# Patient Record
Sex: Male | Born: 1990 | Race: Black or African American | Hispanic: No | Marital: Single | Smoking: Never smoker
Health system: Southern US, Community
[De-identification: ages and names within clinical notes are randomized; demographics above are authoritative.]

## PROBLEM LIST (undated history)

## (undated) DIAGNOSIS — F909 Attention-deficit hyperactivity disorder, unspecified type: Secondary | ICD-10-CM

## (undated) DIAGNOSIS — E785 Hyperlipidemia, unspecified: Secondary | ICD-10-CM

## (undated) HISTORY — DX: Hyperlipidemia, unspecified: E78.5

## (undated) HISTORY — DX: Attention-deficit hyperactivity disorder, unspecified type: F90.9

---
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
## 2008-01-22 DIAGNOSIS — J029 Acute pharyngitis, unspecified: Secondary | ICD-10-CM | POA: Insufficient documentation

## 2008-01-28 ENCOUNTER — Ambulatory Visit: Payer: Self-pay | Admitting: Family Medicine

## 2008-01-28 LAB — CONVERTED CEMR LAB
Cholesterol: 208 mg/dL (ref 0–200)
Direct LDL: 143.5 mg/dL
HDL: 38.5 mg/dL — ABNORMAL LOW (ref >39.0)
Rapid Strep: NEGATIVE
Total CHOL/HDL Ratio: 5.4
Triglycerides: 130 mg/dL (ref 0–149)
VLDL: 26 mg/dL (ref 0–40)

## 2008-02-04 ENCOUNTER — Telehealth: Payer: Self-pay | Admitting: Family Medicine

## 2008-06-16 ENCOUNTER — Telehealth: Payer: Self-pay | Admitting: *Deleted

## 2008-06-23 ENCOUNTER — Ambulatory Visit: Payer: Self-pay | Admitting: Family Medicine

## 2008-06-23 LAB — CONVERTED CEMR LAB
Amphetamine Screen, Ur: NEGATIVE
Barbiturate Quant, Ur: NEGATIVE
Benzodiazepines.: NEGATIVE
Cocaine Metabolites: NEGATIVE
Creatinine,U: 262.2 mg/dL
Marijuana Metabolite: POSITIVE — AB
Methadone: NEGATIVE
Opiate Screen, Urine: NEGATIVE
Phencyclidine (PCP): NEGATIVE
Propoxyphene: NEGATIVE

## 2008-06-26 ENCOUNTER — Telehealth: Payer: Self-pay | Admitting: Family Medicine

## 2008-08-26 ENCOUNTER — Telehealth: Payer: Self-pay | Admitting: *Deleted

## 2008-12-15 ENCOUNTER — Ambulatory Visit: Payer: Self-pay | Admitting: Family Medicine

## 2009-11-09 ENCOUNTER — Ambulatory Visit: Payer: Self-pay | Admitting: Licensed Clinical Social Worker

## 2009-11-16 ENCOUNTER — Ambulatory Visit: Payer: Self-pay | Admitting: Licensed Clinical Social Worker

## 2009-11-25 ENCOUNTER — Ambulatory Visit: Payer: Self-pay | Admitting: Licensed Clinical Social Worker

## 2009-12-08 ENCOUNTER — Ambulatory Visit: Payer: Self-pay | Admitting: Licensed Clinical Social Worker

## 2009-12-22 ENCOUNTER — Ambulatory Visit: Payer: Self-pay | Admitting: Licensed Clinical Social Worker

## 2010-01-04 ENCOUNTER — Ambulatory Visit: Payer: Self-pay | Admitting: Licensed Clinical Social Worker

## 2010-01-10 ENCOUNTER — Ambulatory Visit: Payer: Self-pay | Admitting: Licensed Clinical Social Worker

## 2010-01-11 ENCOUNTER — Ambulatory Visit: Payer: Self-pay | Admitting: Licensed Clinical Social Worker

## 2010-01-19 ENCOUNTER — Ambulatory Visit: Payer: Self-pay | Admitting: Licensed Clinical Social Worker

## 2010-02-02 ENCOUNTER — Ambulatory Visit: Payer: Self-pay | Admitting: Licensed Clinical Social Worker

## 2010-02-17 ENCOUNTER — Ambulatory Visit: Payer: Self-pay | Admitting: Licensed Clinical Social Worker

## 2010-03-03 ENCOUNTER — Ambulatory Visit: Payer: Self-pay | Admitting: Licensed Clinical Social Worker

## 2010-03-07 ENCOUNTER — Telehealth: Payer: Self-pay | Admitting: Family Medicine

## 2010-03-08 ENCOUNTER — Ambulatory Visit: Payer: Self-pay | Admitting: Family Medicine

## 2010-03-08 LAB — CONVERTED CEMR LAB
ALT: 20 units/L (ref 0–53)
AST: 28 units/L (ref 0–37)
Albumin: 4.8 g/dL (ref 3.5–5.2)
Alkaline Phosphatase: 70 units/L (ref 39–117)
BUN: 22 mg/dL (ref 6–23)
Basophils Absolute: 0 10*3/uL (ref 0.0–0.1)
Basophils Relative: 0.4 % (ref 0.0–3.0)
Bilirubin Urine: NEGATIVE
Bilirubin, Direct: 0.1 mg/dL (ref 0.0–0.3)
Blood in Urine, dipstick: NEGATIVE
CO2: 27 meq/L (ref 19–32)
Calcium: 10 mg/dL (ref 8.4–10.5)
Chloride: 102 meq/L (ref 96–112)
Cholesterol: 212 mg/dL — ABNORMAL HIGH (ref 0–200)
Creatinine, Ser: 1.1 mg/dL (ref 0.4–1.5)
Direct LDL: 131.1 mg/dL
Eosinophils Absolute: 0 10*3/uL (ref 0.0–0.7)
Eosinophils Relative: 0.4 % (ref 0.0–5.0)
GFR calc non Af Amer: 108.24 mL/min (ref >60)
Glucose, Bld: 77 mg/dL (ref 70–99)
Glucose, Urine, Semiquant: NEGATIVE
HCT: 43.1 % (ref 39.0–52.0)
HDL: 56.1 mg/dL (ref >39.00)
Hemoglobin: 14.4 g/dL (ref 13.0–17.0)
Lymphocytes Relative: 29.7 % (ref 12.0–46.0)
Lymphs Abs: 1.4 10*3/uL (ref 0.7–4.0)
MCHC: 33.5 g/dL (ref 30.0–36.0)
MCV: 99.1 fL (ref 78.0–100.0)
Monocytes Absolute: 0.2 10*3/uL (ref 0.1–1.0)
Monocytes Relative: 5.2 % (ref 3.0–12.0)
Neutro Abs: 3 10*3/uL (ref 1.4–7.7)
Neutrophils Relative %: 64.3 % (ref 43.0–77.0)
Nitrite: NEGATIVE
Platelets: 193 10*3/uL (ref 150.0–400.0)
Potassium: 4.6 meq/L (ref 3.5–5.1)
RBC: 4.35 M/uL (ref 4.22–5.81)
RDW: 12.8 % (ref 11.5–14.6)
Sodium: 138 meq/L (ref 135–145)
Specific Gravity, Urine: 1.02
TSH: 0.85 microintl units/mL (ref 0.35–5.50)
Total Bilirubin: 0.7 mg/dL (ref 0.3–1.2)
Total CHOL/HDL Ratio: 4
Total Protein: 7.1 g/dL (ref 6.0–8.3)
Triglycerides: 74 mg/dL (ref 0.0–149.0)
Urobilinogen, UA: 0.2
VLDL: 14.8 mg/dL (ref 0.0–40.0)
WBC Urine, dipstick: NEGATIVE
WBC: 4.7 10*3/uL (ref 4.5–10.5)
pH: 6

## 2010-03-10 ENCOUNTER — Ambulatory Visit: Payer: Self-pay | Admitting: Family Medicine

## 2010-03-24 ENCOUNTER — Ambulatory Visit: Payer: Self-pay | Admitting: Licensed Clinical Social Worker

## 2010-04-14 ENCOUNTER — Ambulatory Visit: Payer: Self-pay | Admitting: Licensed Clinical Social Worker

## 2010-05-10 ENCOUNTER — Ambulatory Visit: Payer: Self-pay | Admitting: Licensed Clinical Social Worker

## 2010-07-19 NOTE — Progress Notes (Signed)
Summary: Pt is req to get some other labs added to cpx labs  Phone Note Call from Patient Call back at [phone] cell   Caller: Patient Summary of Call: Pt is sch for cpx labs on 03/08/10. Pt is req to get std test added to labs. Pls advise.     Initial call taken by: Lucy Antigua,  March 07, 2010 8:56 AM  Follow-up for Phone Call        ok Follow-up by: Roderick Pee MD,  March 07, 2010 4:11 PM  Additional Follow-up for Phone Call Additional follow up Details #1::        Pt has not every had an std test done before in his life and would like to be checked for any std's Additional Follow-up by: Kathrynn Speed CMA,  March 07, 2010 4:35 PM    Additional Follow-up for Phone Call Additional follow up Details #2::    rachel..pls call.set up std's Follow-up by: Roderick Pee MD,  March 07, 2010 9:08 PM  Additional Follow-up for Phone Call Additional follow up Details #3:: Details for Additional Follow-up Action Taken: patient came to lab today Additional Follow-up by: Kern Reap CMA Duncan Dull),  March 08, 2010 5:29 PM

## 2010-07-19 NOTE — Assessment & Plan Note (Signed)
Summary: cpx/njr   Vital Signs:  Patient profile:   20 year old male Height:      71.25 inches Weight:      176 pounds BMI:     24.46 Temp:     98.1 degrees F oral BP sitting:   110 / 70  (left arm) Cuff size:   regular  Vitals Entered By: Kern Reap CMA Duncan Dull) (March 10, 2010 9:26 AM) CC: cpx Is Patient Diabetic? No Pain Assessment Patient in pain? no        CC:  cpx.  History of Present Illness: Andrew Lee is a 20 year old single male, who now lives at home and goes to [employer] [facility] comes in today for general physical examination  Is always been in excellent health.  He said no chronic health problems.  At one time he took Adderall for ADD.  However, he no longer thinks that he needs it.  He, states he's doing well in school.  Plays basketball for exercise  Allergies: No Known Drug Allergies  Past History:  Past medical, surgical, family and social histories (including risk factors) reviewed, and no changes noted (except as noted below).  Past Medical History: Reviewed history from 01/28/2008 and no changes required. ADD  Family History: Reviewed history from 01/28/2008 and no changes required. Family History High cholesterol  Social History: Reviewed history from 01/28/2008 and no changes required. Occupation: Single Never Smoked Alcohol use-no Drug use-no Regular exercise-yes  Review of Systems      See HPI  Physical Exam  General:  Well-developed,well-nourished,in no acute distress; alert,appropriate and cooperative throughout examination Head:  Normocephalic and atraumatic without obvious abnormalities. No apparent alopecia or balding. Eyes:  No corneal or conjunctival inflammation noted. EOMI. Perrla. Funduscopic exam benign, without hemorrhages, exudates or papilledema. Vision grossly normal. Ears:  External ear exam shows no significant lesions or deformities.  Otoscopic examination reveals clear canals, tympanic membranes are intact bilaterally  without bulging, retraction, inflammation or discharge. Hearing is grossly normal bilaterally. Nose:  External nasal examination shows no deformity or inflammation. Nasal mucosa are pink and moist without lesions or exudates. Mouth:  Oral mucosa and oropharynx without lesions or exudates.  Teeth in good repair. Neck:  No deformities, masses, or tenderness noted. Chest Wall:  No deformities, masses, tenderness or gynecomastia noted. Breasts:  No masses or gynecomastia noted Lungs:  Normal respiratory effort, chest expands symmetrically. Lungs are clear to auscultation, no crackles or wheezes. Heart:  Normal rate and regular rhythm. S1 and S2 normal without gallop, murmur, click, rub or other extra sounds. Abdomen:  Bowel sounds positive,abdomen soft and non-tender without masses, organomegaly or hernias noted. Genitalia:  Testes bilaterally descended without nodularity, tenderness or masses. No scrotal masses or lesions. No penis lesions or urethral discharge. Msk:  No deformity or scoliosis noted of thoracic or lumbar spine.   Pulses:  R and L carotid,radial,femoral,dorsalis pedis and posterior tibial pulses are full and equal bilaterally Extremities:  No clubbing, cyanosis, edema, or deformity noted with normal full range of motion of all joints.   Neurologic:  No cranial nerve deficits noted. Station and gait are normal. Plantar reflexes are down-going bilaterally. DTRs are symmetrical throughout. Sensory, motor and coordinative functions appear intact. Skin:  Intact without suspicious lesions or rashes Cervical Nodes:  No lymphadenopathy noted Axillary Nodes:  No palpable lymphadenopathy Inguinal Nodes:  No significant adenopathy Psych:  Cognition and judgment appear intact. Alert and cooperative with normal attention span and concentration. No apparent delusions, illusions, hallucinations  Impression & Recommendations:  Problem # 1:  ADD (ICD-314.00) Assessment Unchanged  Complete  Medication List: 1)  Zyrtec Allergy 10 Mg Tabs (Cetirizine hcl) .... As needed 2)  Adderall Xr 20 Mg Cp24 (Amphetamine-dextroamphetamine) .... Take 1 tablet by mouth once a day 3)  Adderall Xr 20 Mg Xr24h-cap (Amphetamine-dextroamphetamine) .... Take one tab once daily   fill in one month 4)  Adderall Xr 20 Mg Xr24h-cap (Amphetamine-dextroamphetamine) .... Take one tab once daily   fill in two months  Patient Instructions: 1)  Please schedule a follow-up appointment in 1 year.   Immunization History:  Tetanus/Td Immunization History:    Tetanus/Td:  historical (06/19/2006)

## 2010-08-01 ENCOUNTER — Ambulatory Visit (INDEPENDENT_AMBULATORY_CARE_PROVIDER_SITE_OTHER)
Admission: RE | Admit: 2010-08-01 | Discharge: 2010-08-01 | Disposition: A | Discharge status: Home / Self Care | Payer: Self-pay | Source: Ambulatory Visit | Attending: Family Medicine | Admitting: Family Medicine

## 2010-08-01 ENCOUNTER — Telehealth: Payer: Self-pay | Admitting: Family Medicine

## 2010-08-01 ENCOUNTER — Encounter: Payer: Self-pay | Admitting: Family Medicine

## 2010-08-01 ENCOUNTER — Ambulatory Visit (INDEPENDENT_AMBULATORY_CARE_PROVIDER_SITE_OTHER): Payer: Self-pay | Admitting: Family Medicine

## 2010-08-01 VITALS — BP 120/80 | Temp 98.1°F | Ht 71.25 in | Wt 183.0 lb

## 2010-08-01 DIAGNOSIS — M25511 Pain in right shoulder: Secondary | ICD-10-CM

## 2010-08-01 DIAGNOSIS — M25519 Pain in unspecified shoulder: Secondary | ICD-10-CM

## 2010-08-01 MED ORDER — HYDROCODONE-ACETAMINOPHEN 7.5-750 MG PO TABS: 1.0000 | ORAL_TABLET | Freq: Four times a day (QID) | ORAL | Status: AC | PRN

## 2010-08-01 NOTE — Progress Notes (Signed)
  Subjective:    Patient ID: Andrew Lee, male    DOB: 1991/06/08, 20 y.o.   MRN: [id number]  HPI Andrew Lee is a 20 year old male, who comes in today for evaluation of pain in his right shoulder.  He was playing basketball.  This morning and fell on his right shoulder.  He says the pain is a 7 on a scale of one to 10.  He points to his right upper shoulder between the cervical spine as the source of his pain.  Most the skeletal review of systems otherwise negative   Review of Systems     Objective:   Physical Exam Well-developed male in no acute distress.  Examination of the hand, elbow and shoulder all appear normal and has full range of motion of the shoulder area.  Some tenderness in the muscle.  Clavicle appears normal       Assessment & Plan:  Right shoulder pain   Will sent for x-rays to rule out fracture, then ice, and Vicodin

## 2010-08-01 NOTE — Patient Instructions (Signed)
After your x-ray, then go home.  I will call you the report

## 2010-08-01 NOTE — Telephone Encounter (Signed)
I called and talked with mom about Andrew Lee's x-ray.  X-ray shows no fracture.  Treat symptomatically with Motrin, ice, Vicodin at bedtime as needed.  Return p.r.n.

## 2010-08-24 ENCOUNTER — Ambulatory Visit (INDEPENDENT_AMBULATORY_CARE_PROVIDER_SITE_OTHER): Payer: Managed Care, Other (non HMO) | Admitting: Internal Medicine

## 2010-08-24 ENCOUNTER — Encounter: Payer: Self-pay | Admitting: Internal Medicine

## 2010-08-24 VITALS — BP 110/70 | HR 86 | Temp 98.4°F | Ht 71.0 in | Wt 181.0 lb

## 2010-08-24 DIAGNOSIS — Z202 Contact with and (suspected) exposure to infections with a predominantly sexual mode of transmission: Secondary | ICD-10-CM

## 2010-08-24 MED ORDER — AZITHROMYCIN 250 MG PO TABS: ORAL_TABLET | ORAL | Status: DC

## 2010-08-25 ENCOUNTER — Telehealth: Payer: Self-pay

## 2010-08-25 NOTE — Telephone Encounter (Signed)
Message copied by Kyung Rudd on Thu Aug 25, 2010  2:17 PM ------      Message from: Letitia Libra, Maisie Fus      Created: Thu Aug 25, 2010 12:08 PM       Std testing neg

## 2010-08-25 NOTE — Telephone Encounter (Signed)
Pt aware.

## 2010-08-25 NOTE — Telephone Encounter (Signed)
Message copied by Kyung Rudd on Thu Aug 25, 2010 12:16 PM ------      Message from: Letitia Libra, Maisie Fus      Created: Thu Aug 25, 2010 12:08 PM       Std testing neg

## 2010-08-29 ENCOUNTER — Encounter: Payer: Self-pay | Admitting: Internal Medicine

## 2010-08-29 DIAGNOSIS — Z202 Contact with and (suspected) exposure to infections with a predominantly sexual mode of transmission: Secondary | ICD-10-CM | POA: Insufficient documentation

## 2010-08-29 NOTE — Progress Notes (Signed)
  Subjective:    Patient ID: Andrew Lee, male    DOB: 09-30-1990, 20 y.o.   MRN: [id number]  HPI Pt presents to clinic for evaluation of possible STD. States has had recent intercourse with male diagnosed with chlamydia. She was tested and treated and knows of no other std's. Pt is asx and denies urethral discharge, penile pain or dysuria. Chart review indicates neg HIV, RPR dated 9/11. No other complaints.  Reviewed pmh, medications, and allergies.    Review of Systems  Constitutional: Negative for fever and chills.  Genitourinary: Negative for dysuria, urgency, frequency, penile swelling, genital sores, penile pain and testicular pain.  Skin: Negative for color change and rash.        Objective:   Physical Exam  Nursing note and vitals reviewed. Constitutional: He appears well-developed and well-nourished. No distress.  HENT:  Head: Normocephalic and atraumatic.  Eyes: Conjunctivae are normal.  Genitourinary: Testes normal and penis normal. No penile erythema or penile tenderness. No discharge found.  Neurological: He is alert.  Skin: Skin is warm and dry. No rash noted. He is not diaphoretic. No erythema.          Assessment & Plan:

## 2010-08-29 NOTE — Assessment & Plan Note (Signed)
Asx. Given known chlamydia exposure prescribe 1gm zithromax. Obtain repeat hiv as well as urine gc and chlamydia.

## 2010-09-05 ENCOUNTER — Ambulatory Visit: Payer: Self-pay | Admitting: Family Medicine

## 2010-11-04 NOTE — Discharge Summary (Signed)
NAME:  Andrew Lee, Andrew Lee                        ACCOUNT NO.:  [id number]   MEDICAL RECORD NO.:  [id number]                   PATIENT TYPE:  OBV   LOCATION:  6123                                 FACILITY:  [facility]   PHYSICIAN:  Billey Gosling, M.D.                 DATE OF BIRTH:  1990/12/04   DATE OF ADMISSION:  10/19/2002  DATE OF DISCHARGE:  10/21/2002                                 DISCHARGE SUMMARY   DISCHARGE DIAGNOSES:  1. Right scapular abscess.  2. Multiple lung abscesses.  3. Febrile illness.   CONSULTATIONS:  Pediatric surgery, Dr. Levie Heritage.   PROCEDURES:  1. Ultrasound of right scapula.  2. MRI of right scapula.   DISCHARGE MEDICATIONS:  1. Ancef 1 g IV q.8h.  2. Tylenol 650 mg every four hours p.r.n. pain.  3. Ibuprofen 400 mg every six hours p.r.n. pain.   ADMISSION HISTORY:  This 20 year old African American male presented for  emergent medical care secondary to a four-day history of fever and right  shoulder and scapular swelling.  The patient reports he suffered right  shoulder trauma one week prior to presentation while playing basketball and  hitting a wall.  The patient developed fevers to 104 degrees starting four  days prior to admission.   HOSPITAL COURSE:  The patient was admitted and started on IV Ancef for  possible abscess versus infected hematoma of right scapular region.  Ultrasound was obtained which showed a 3 x 3 x 1 cm hyperechoic collection  consistent with infected hematoma or abscess.  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  The patient continued to spike fevers to 102 over two days of  hospitalization and he was in no acute distress, eating well.  Pediatric  surgery saw the patient and recommended orthopedic surgery consult.  Orthopedic surgery was then contacted and recommended transfer to  [facility] for further treatment with possible surgery of the scapular  abscess.   LABORATORY DATA:  Urinalysis within normal limits.  Urine cultures pending.  Basic metabolic panel showed sodium 135, potassium 4.3, chloride 99, bicarb  27, glucose 93, BUN 15, creatinine 0.7, calcium 8.8, albumin 2.9,  total protein 7, AST 22, ALT 20, alkaline phosphatase 190, total bilirubin  0.6.  PT 14.5, INR 1.1, PTT 36.  Sed rate 118.  Monospot test negative.  C-  reactive protein 17.3.  [facility] spotted fever test pending.  Blood  cultures x2 have been negative to date.                                               Billey Gosling, M.D.  AS/MEDQ  D:  10/21/2002  T:  10/21/2002  Job:  [id number]   cc:   Dr. Irineo Axon  [facility]

## 2012-05-01 ENCOUNTER — Telehealth: Payer: Self-pay | Admitting: Family Medicine

## 2012-05-01 NOTE — Telephone Encounter (Signed)
noted 

## 2012-05-01 NOTE — Telephone Encounter (Signed)
Caller: Hugo/Patient; Patient Name: Andrew Lee; PCP: Kelle Darting [facility]); Best Callback Phone Number: [phone]. Call regarding lump and rendess to right upper eyelid.  Afebrile.  Emergent symtpoms ruled out per Eye: Infections or Irritation protocol w/exception to 'Single localized red, swollen, tender lump on upper or lower edge or inside eyelid'.  Home care advice given.

## 2012-08-13 IMAGING — CR DG SHOULDER 2+V*R*
3 series · 3 of 3 positions shown · non-contrast
Comparison: None.

CLINICAL DATA: Fall onto the right shoulder while playing
basketball.

RIGHT SHOULDER - 2+ VIEW

[view not recorded (1 of 3)]
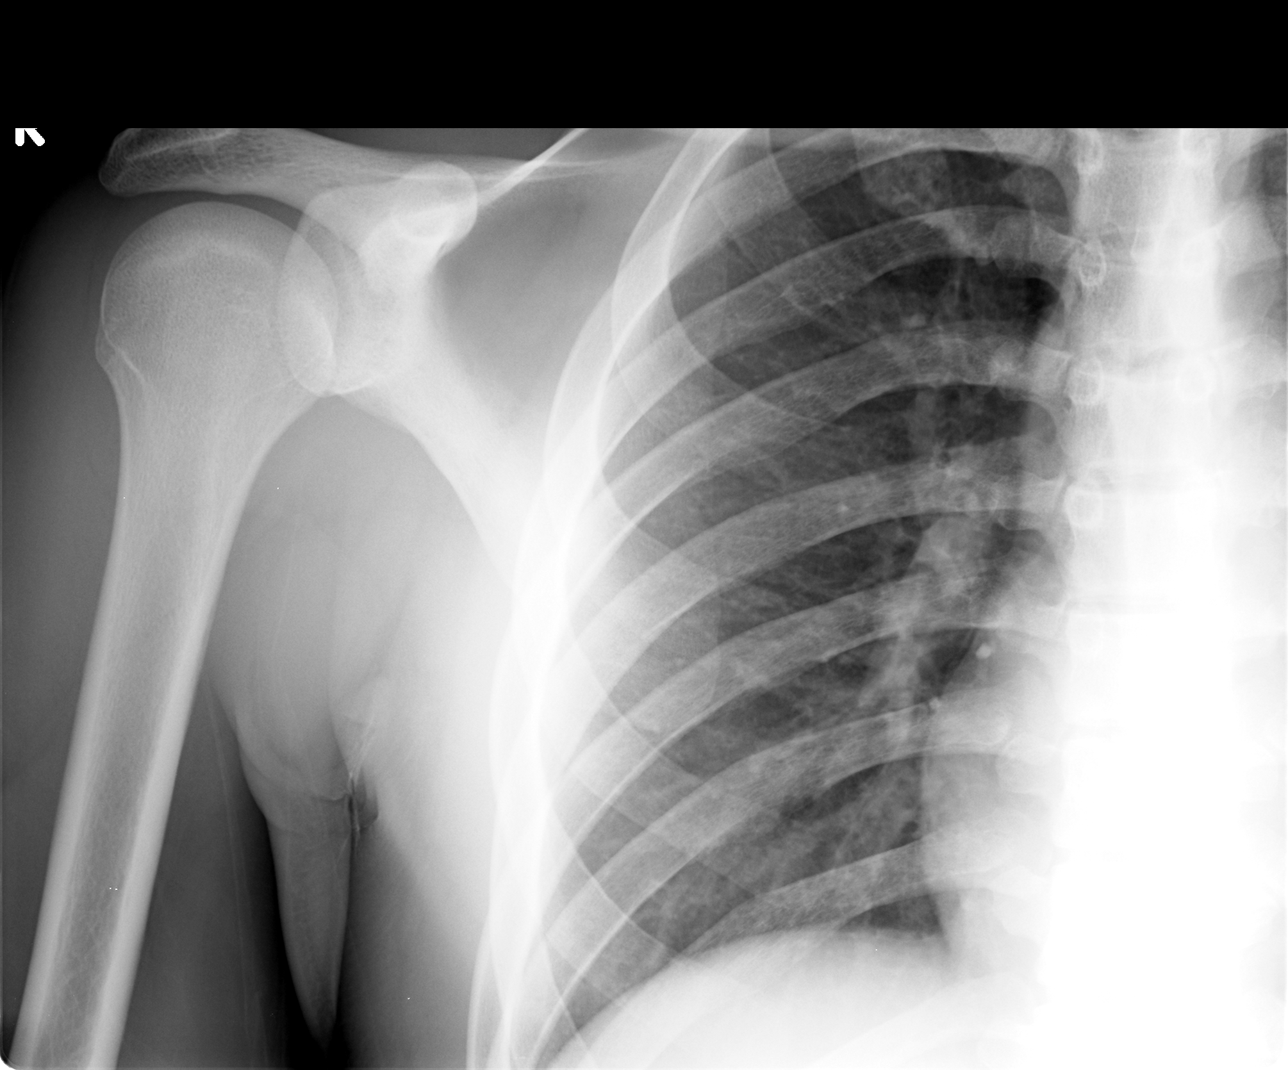

[view not recorded (2 of 3)]
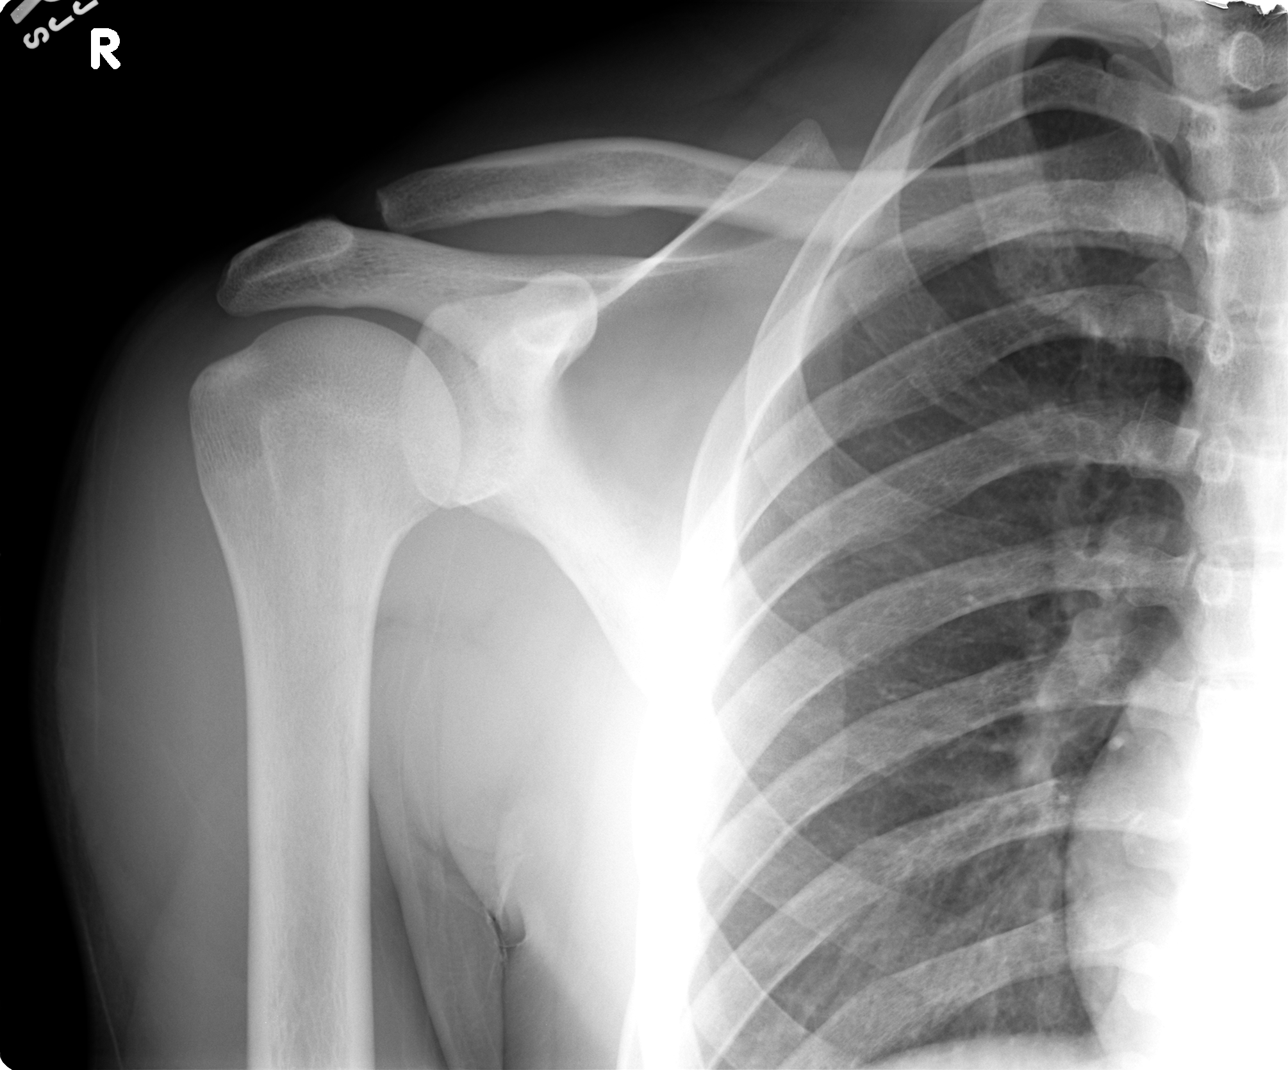

[view not recorded (3 of 3)]
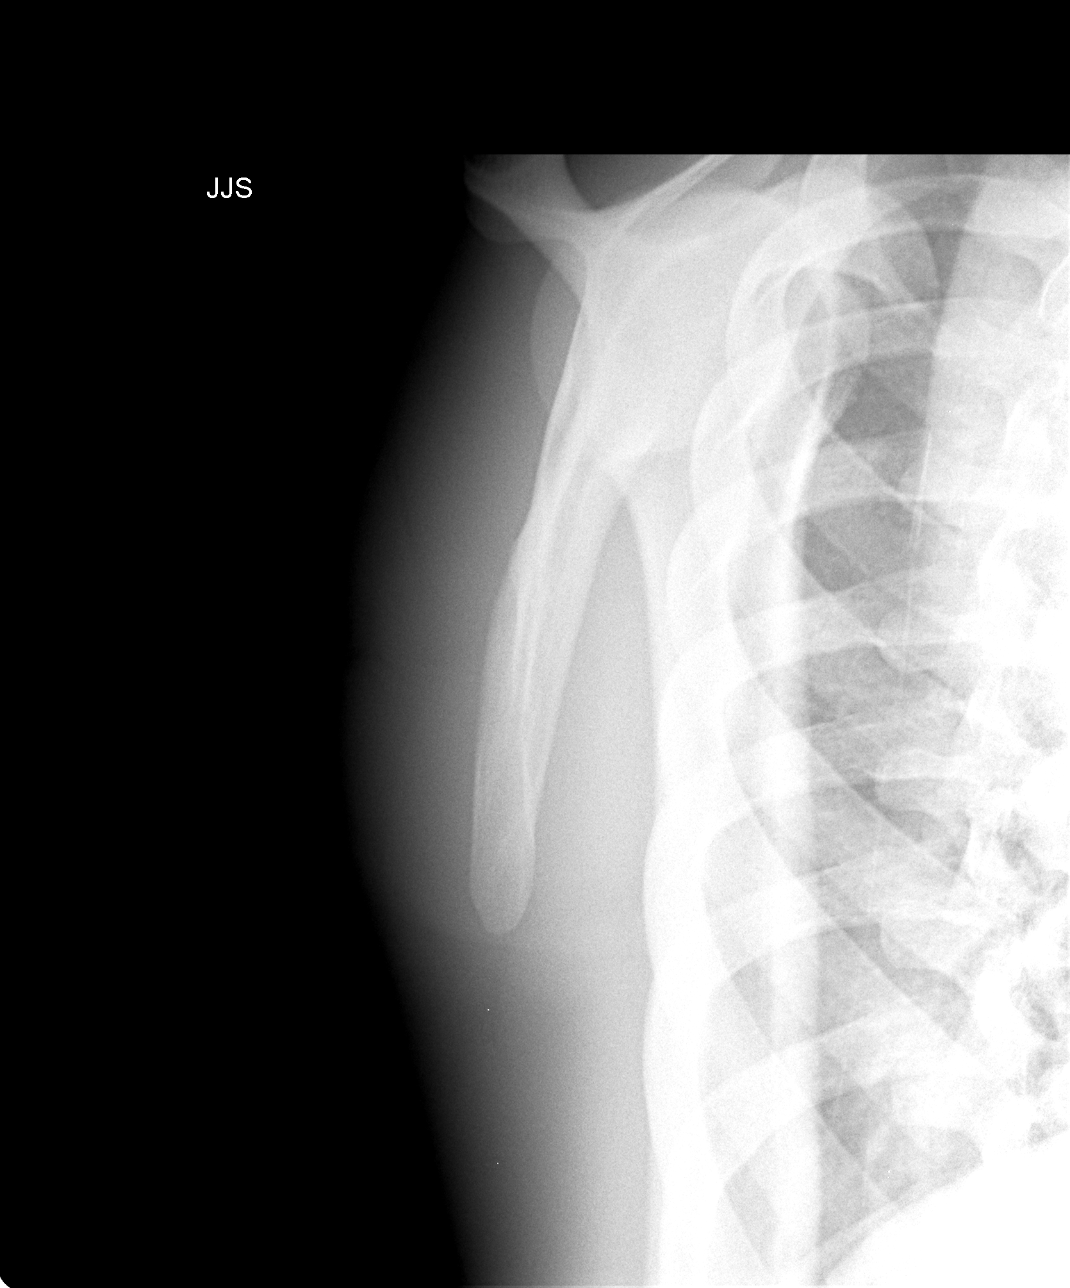

[3 of 3 positions shown; findings below may reference images not displayed]

FINDINGS: The acromioclavicular distance is in the upper normal
range for age.  If the patient has tenderness is directly over the
AC joint, then standing weighted views including both the AC joints
may be warranted in order to assess for subluxation under stress.

No fracture or dislocation identified.
IMPRESSION: 1.  No fracture or dislocation identified.
2.  If the patient has tenderness is directly over the AC joint,
then stress views of the AC joint may be warranted.

## 2018-04-19 ENCOUNTER — Encounter: Payer: Self-pay | Admitting: Family Medicine

## 2018-04-19 ENCOUNTER — Ambulatory Visit (INDEPENDENT_AMBULATORY_CARE_PROVIDER_SITE_OTHER): Payer: Managed Care, Other (non HMO) | Admitting: Family Medicine

## 2018-04-19 VITALS — BP 98/60 | HR 54 | Temp 98.0°F | Ht 71.0 in | Wt 204.0 lb

## 2018-04-19 DIAGNOSIS — Z131 Encounter for screening for diabetes mellitus: Secondary | ICD-10-CM

## 2018-04-19 DIAGNOSIS — Z0000 Encounter for general adult medical examination without abnormal findings: Secondary | ICD-10-CM | POA: Diagnosis not present

## 2018-04-19 DIAGNOSIS — B070 Plantar wart: Secondary | ICD-10-CM

## 2018-04-19 DIAGNOSIS — Z13220 Encounter for screening for lipoid disorders: Secondary | ICD-10-CM | POA: Diagnosis not present

## 2018-04-19 DIAGNOSIS — L84 Corns and callosities: Secondary | ICD-10-CM | POA: Diagnosis not present

## 2018-04-19 DIAGNOSIS — Z113 Encounter for screening for infections with a predominantly sexual mode of transmission: Secondary | ICD-10-CM

## 2018-04-19 LAB — CBC WITH DIFFERENTIAL/PLATELET
Basophils Absolute: 0 10*3/uL (ref 0.0–0.1)
Basophils Relative: 0.4 % (ref 0.0–3.0)
EOS PCT: 1.2 % (ref 0.0–5.0)
Eosinophils Absolute: 0 10*3/uL (ref 0.0–0.7)
HEMATOCRIT: 46.2 % (ref 39.0–52.0)
HEMOGLOBIN: 15.7 g/dL (ref 13.0–17.0)
LYMPHS ABS: 2.1 10*3/uL (ref 0.7–4.0)
Lymphocytes Relative: 55.9 % — ABNORMAL HIGH (ref 12.0–46.0)
MCHC: 34 g/dL (ref 30.0–36.0)
MCV: 96.4 fl (ref 78.0–100.0)
Monocytes Absolute: 0.3 10*3/uL (ref 0.1–1.0)
Monocytes Relative: 7.9 % (ref 3.0–12.0)
Neutro Abs: 1.3 10*3/uL — ABNORMAL LOW (ref 1.4–7.7)
Neutrophils Relative %: 34.6 % — ABNORMAL LOW (ref 43.0–77.0)
Platelets: 209 10*3/uL (ref 150.0–400.0)
RBC: 4.79 Mil/uL (ref 4.22–5.81)
RDW: 12.7 % (ref 11.5–15.5)
WBC: 3.7 10*3/uL — AB (ref 4.0–10.5)

## 2018-04-19 LAB — HEMOGLOBIN A1C: Hgb A1c MFr Bld: 5.8 % (ref 4.6–6.5)

## 2018-04-19 LAB — LIPID PANEL
CHOLESTEROL: 237 mg/dL — AB (ref 0–200)
HDL: 55.2 mg/dL (ref >39.00)
LDL CALC: 168 mg/dL — AB (ref 0–99)
NonHDL: 181.8
TRIGLYCERIDES: 71 mg/dL (ref 0.0–149.0)
Total CHOL/HDL Ratio: 4
VLDL: 14.2 mg/dL (ref 0.0–40.0)

## 2018-04-19 LAB — BASIC METABOLIC PANEL
BUN: 24 mg/dL — ABNORMAL HIGH (ref 6–23)
CALCIUM: 9.8 mg/dL (ref 8.4–10.5)
CO2: 27 meq/L (ref 19–32)
CREATININE: 1.08 mg/dL (ref 0.40–1.50)
Chloride: 104 mEq/L (ref 96–112)
GFR: 103.58 mL/min (ref >60.00)
Glucose, Bld: 87 mg/dL (ref 70–99)
Potassium: 4.1 mEq/L (ref 3.5–5.1)
SODIUM: 140 meq/L (ref 135–145)

## 2018-04-19 NOTE — Progress Notes (Signed)
Subjective:     Andrew Lee is a 27 y.o. male and is here for a comprehensive physical exam. The patient reports problems - R foot soreness.  Pt unsure how long foot has been sore.  Notes a hard area on the sole of  Foot that hurts.  Denies injury.  Has not tried anything for this.  Pt states he was told his cholesterol was high in the past.  Does not recall how high.  Pt request STI testing.  Denies urinary symptoms.  Allergies: NKDA  Social Hx: Pt is single, but in a relationship.  Pt works at [employer] and at another job.  Pt is sexually active, not using condoms.  Pt endorses drinking a 6 pk of beer each Saturday.  Pt also endorses smoking marijuana daily.   Social History   Socioeconomic History  . Marital status: Single    Spouse name: Not on file  . Number of children: Not on file  . Years of education: Not on file  . Highest education level: Not on file  Occupational History  . Not on file  Social Needs  . Financial resource strain: Not on file  . Food insecurity:    Worry: Not on file    Inability: Not on file  . Transportation needs:    Medical: Not on file    Non-medical: Not on file  Tobacco Use  . Smoking status: Never Smoker  . Smokeless tobacco: Never Used  Substance and Sexual Activity  . Alcohol use: Yes  . Drug use: Yes    Types: Marijuana  . Sexual activity: Yes  Lifestyle  . Physical activity:    Days per week: Not on file    Minutes per session: Not on file  . Stress: Not on file  Relationships  . Social connections:    Talks on phone: Not on file    Gets together: Not on file    Attends religious service: Not on file    Active member of club or organization: Not on file    Attends meetings of clubs or organizations: Not on file    Relationship status: Not on file  . Intimate partner violence:    Fear of current or ex partner: Not on file    Emotionally abused: Not on file    Physically abused: Not on file    Forced sexual activity: Not  on file  Other Topics Concern  . Not on file  Social History Narrative  . Not on file   Health Maintenance  Topic Date Due  . TETANUS/TDAP  06/19/2016  . INFLUENZA VACCINE  01/17/2018  . HIV Screening  Completed    The following portions of the patient's history were reviewed and updated as appropriate: allergies, current medications, past family history, past medical history, past social history, past surgical history and problem list.  Review of Systems A comprehensive review of systems was negative.   Objective:    BP 98/60 (BP Location: Left Arm, Patient Position: Sitting, Cuff Size: Large)   Pulse (!) 54   Temp 98 F (36.7 C) (Oral)   Ht 5\' 11"  (1.803 m)   Wt 204 lb (92.5 kg)   SpO2 98%   BMI 28.45 kg/m  General appearance: alert, cooperative and no distress, aroma of marijuana present. Head: Normocephalic, without obvious abnormality, atraumatic Eyes: conjunctivae/corneas clear. PERRL, EOM's intact. Fundi benign. Ears: normal TM's and external ear canals both ears Nose: Nares normal. Septum midline. Mucosa normal. No drainage  or sinus tenderness. Throat: lips, mucosa, and tongue normal; teeth and gums normal Neck: no adenopathy, no carotid bruit, no JVD, supple, symmetrical, trachea midline and thyroid not enlarged, symmetric, no tenderness/mass/nodules Lungs: clear to auscultation bilaterally Heart: regular rate and rhythm, S1, S2 normal, no murmur, click, rub or gallop Abdomen: soft, non-tender; bowel sounds normal; no masses,  no organomegaly Extremities: extremities normal, atraumatic, no cyanosis or edema Skin: Skin color, texture, turgor normal. No rashes or lesions  R mid lateral plantar surface of foot with 1 cm callus, callus debrided, plantar wart noted Neurologic: Alert and oriented X 3, normal strength and tone. Normal symmetric reflexes. Normal coordination and gait    Assessment:    Healthy male exam.      Plan:     Anticipatory guidance given  including wearing seatbelts, smoke detectors in the home, increasing physical activity, increasing p.o. intake of water and vegetables. -will obtain labs -will obtain STI testing -given handout -advised to decrease/quit EtOH and marijuana use. See After Visit Summary for Counseling Recommendations    Plantar wart -Consent obtained, callus/wart debrided with scalpel blade.  Pt tolerated procedure well -Discussed soaking foot in warm water with Epson salt to soften the skin.  Can use foot file to remove the skin. -Discussed wart removal.  Consider referral to podiatry  F/u prn  Abbe Amsterdam, MD

## 2018-04-19 NOTE — Patient Instructions (Addendum)
You can soak your foot in warm water with Epson salt in it.  Epson salt can be found on the shelf at your local drugstore.  You can also use a fingernail file or file to help remove dried skin from your feet.  Preventive Care 18-39 Years, Male Preventive care refers to lifestyle choices and visits with your health care provider that can promote health and wellness. What does preventive care include?  A yearly physical exam. This is also called an annual well check.  Dental exams once or twice a year.  Routine eye exams. Ask your health care provider how often you should have your eyes checked.  Personal lifestyle choices, including: ? Daily care of your teeth and gums. ? Regular physical activity. ? Eating a healthy diet. ? Avoiding tobacco and drug use. ? Limiting alcohol use. ? Practicing safe sex. What happens during an annual well check? The services and screenings done by your health care provider during your annual well check will depend on your age, overall health, lifestyle risk factors, and family history of disease. Counseling Your health care provider may ask you questions about your:  Alcohol use.  Tobacco use.  Drug use.  Emotional well-being.  Home and relationship well-being.  Sexual activity.  Eating habits.  Work and work Statistician.  Screening You may have the following tests or measurements:  Height, weight, and BMI.  Blood pressure.  Lipid and cholesterol levels. These may be checked every 5 years starting at age 78.  Diabetes screening. This is done by checking your blood sugar (glucose) after you have not eaten for a while (fasting).  Skin check.  Hepatitis C blood test.  Hepatitis B blood test.  Sexually transmitted disease (STD) testing.  Discuss your test results, treatment options, and if necessary, the need for more tests with your health care provider. Vaccines Your health care provider may recommend certain vaccines, such  as:  Influenza vaccine. This is recommended every year.  Tetanus, diphtheria, and acellular pertussis (Tdap, Td) vaccine. You may need a Td booster every 10 years.  Varicella vaccine. You may need this if you have not been vaccinated.  HPV vaccine. If you are 46 or younger, you may need three doses over 6 months.  Measles, mumps, and rubella (MMR) vaccine. You may need at least one dose of MMR.You may also need a second dose.  Pneumococcal 13-valent conjugate (PCV13) vaccine. You may need this if you have certain conditions and have not been vaccinated.  Pneumococcal polysaccharide (PPSV23) vaccine. You may need one or two doses if you smoke cigarettes or if you have certain conditions.  Meningococcal vaccine. One dose is recommended if you are age 25-21 years and a first-year college student living in a residence hall, or if you have one of several medical conditions. You may also need additional booster doses.  Hepatitis A vaccine. You may need this if you have certain conditions or if you travel or work in places where you may be exposed to hepatitis A.  Hepatitis B vaccine. You may need this if you have certain conditions or if you travel or work in places where you may be exposed to hepatitis B.  Haemophilus influenzae type b (Hib) vaccine. You may need this if you have certain risk factors.  Talk to your health care provider about which screenings and vaccines you need and how often you need them. This information is not intended to replace advice given to you by your health care provider.  Make sure you discuss any questions you have with your health care provider. Document Released: 08/01/2001 Document Revised: 02/23/2016 Document Reviewed: 04/06/2015 Elsevier Interactive Patient Education  2018 [place] are small areas of thickened skin that occur on the top, sides, or tip of a toe. They contain a cone-shaped core with a point that can press on a  nerve below. This causes pain. Calluses are areas of thickened skin that can occur anywhere on the body including hands, fingers, palms, soles of the feet, and heels.Calluses are usually larger than corns. What are the causes? Corns and calluses are caused by rubbing (friction) or pressure, such as from shoes that are too tight or do not fit properly. What increases the risk? Corns are more likely to develop in people who have toe deformities, such as hammer toes. Since calluses can occur with friction to any area of the skin, calluses are more likely to develop in people who:  Work with their hands.  Wear shoes that fit poorly, shoes that are too tight, or shoes that are high-heeled.  Have toes deformities.  What are the signs or symptoms? Symptoms of a corn or callus include:  A hard growth on the skin.  Pain or tenderness under the skin.  Redness and swelling.  Increased discomfort while wearing tight-fitting shoes.  How is this diagnosed? Corns and calluses may be diagnosed with a medical history and physical exam. How is this treated? Corns and calluses may be treated with:  Removing the cause of the friction or pressure. This may include: ? Changing your shoes. ? Wearing shoe inserts (orthotics) or other protective layers in your shoes, such as a corn pad. ? Wearing gloves.  Medicines to help soften skin in the hardened, thickened areas.  Reducing the size of the corn or callus by removing the dead layers of skin.  Antibiotic medicines to treat infection.  Surgery, if a toe deformity is the cause.  Follow these instructions at home:  Take medicines only as directed by your health care provider.  If you were prescribed an antibiotic, finish all of it even if you start to feel better.  Wear shoes that fit well. Avoid wearing high-heeled shoes and shoes that are too tight or too loose.  Wear any padding, protective layers, gloves, or orthotics as directed by your  health care provider.  Soak your hands or feet and then use a file or pumice stone to soften your corn or callus. Do this as directed by your health care provider.  Check your corn or callus every day for signs of infection. Watch for: ? Redness, swelling, or pain. ? Fluid, blood, or pus. Contact a health care provider if:  Your symptoms do not improve with treatment.  You have increased redness, swelling, or pain at the site of your corn or callus.  You have fluid, blood, or pus coming from your corn or callus.  You have new symptoms. This information is not intended to replace advice given to you by your health care provider. Make sure you discuss any questions you have with your health care provider. Document Released: 03/11/2004 Document Revised: 12/24/2015 Document Reviewed: 06/01/2014 Elsevier Interactive Patient Education  2018 [place].  Plantar Warts Warts are small growths on the skin. They can occur on various areas of the body. When they occur on the underside (sole) of the foot, they are called plantar warts. Plantar warts often occur in groups, with several small warts  around a larger growth. They tend to develop over areas of pressure, such as the heel or the ball of the foot. Most warts are not painful, and they usually do not cause problems. However, plantar warts may cause pain when you walk because pressure is applied to them. Warts often go away on their own in time. Various treatments may be done if needed. Sometimes, warts go away and then they come back again. What are the causes? Plantar warts are caused by a type of virus that is called human papillomavirus (HPV). HPV attacks a break in the skin of the foot. Walking barefoot can lead to exposure to the virus. These warts may spread to other areas of the sole. They spread to other areas of the body only through direct contact. What increases the risk? Plantar warts are more likely to develop in:  People who  are 73-57 years of age.  People who use public showers or locker rooms.  People who have a weakened body defense system (immune system).  What are the signs or symptoms? Plantar warts may be flat or slightly raised. They may grow into the deeper layers of skin or rise above the surface of the skin. Most plantar warts have a rough surface. They may cause pain when you use your foot to support your body weight. How is this diagnosed? A plantar wart can usually be diagnosed from its appearance. In some cases, a tissue sample may be removed (biopsy) to be looked at under a microscope. How is this treated? In many cases, warts do not need treatment. Without treatment, they often go away over a period of many months to a couple years. If treatment is needed, options may include:  Applying medicated solutions, creams, or patches to the wart. These may be over-the-counter or prescription medicines that make the skin soft so that layers will gradually shed away. In many cases, the medicine is applied one or two times per day and covered with a bandage.  Putting duct tape over the top of the wart (occlusion). You will leave the tape in place for as long as told by your health care provider, then you will replace it with a new strip of tape. This is done until the wart goes away.  Freezing the wart with liquid nitrogen (cryotherapy).  Burning the wart with: ? Laser treatment. ? An electrified probe (electrocautery).  Injection of a medicine (Candida antigen) into the wart to help the body's immune system to fight off the wart.  Surgery to remove the wart.  Follow these instructions at home:  Apply medicated creams or solutions only as told by your health care provider. This may involve: ? Soaking the affected area in warm water. ? Removing the top layer of softened skin before you apply the medicine. A pumice stone works well for removing the tissue. ? Applying a bandage over the affected area  after you apply the medicine. ? Repeating the process daily or as told by your health care provider.  Do not scratch or pick at a wart.  Wash your hands after you touch a wart.  If a wart is painful, try applying a bandage with a hole in the middle over the wart. The helps to take pressure off the wart.  Keep all follow-up visits as told by your health care provider. This is important. How is this prevented? Take these actions to help prevent warts:  Wear shoes and socks. Change your socks daily.  Keep your  feet clean and dry.  Check your feet regularly.  Avoid direct contact with warts on other people.  Contact a health care provider if:  Your warts do not improve after treatment.  You have redness, swelling, or pain at the site of a wart.  You have bleeding from a wart that does not stop with light pressure.  You have diabetes and you develop a wart. This information is not intended to replace advice given to you by your health care provider. Make sure you discuss any questions you have with your health care provider. Document Released: 08/26/2003 Document Revised: 11/11/2015 Document Reviewed: 08/31/2014 Elsevier Interactive Patient Education  [employer].

## 2018-04-22 LAB — HIV ANTIBODY (ROUTINE TESTING W REFLEX): HIV 1&2 Ab, 4th Generation: NONREACTIVE

## 2018-04-22 LAB — C. TRACHOMATIS/N. GONORRHOEAE RNA
C. trachomatis RNA, TMA: NOT DETECTED
N. GONORRHOEAE RNA, TMA: NOT DETECTED

## 2018-04-22 LAB — RPR: RPR Ser Ql: NONREACTIVE

## 2020-04-01 ENCOUNTER — Other Ambulatory Visit: Payer: Self-pay

## 2020-04-01 ENCOUNTER — Ambulatory Visit (INDEPENDENT_AMBULATORY_CARE_PROVIDER_SITE_OTHER): Payer: Managed Care, Other (non HMO) | Admitting: Family Medicine

## 2020-04-01 ENCOUNTER — Encounter: Payer: Self-pay | Admitting: Family Medicine

## 2020-04-01 VITALS — BP 102/70 | HR 54 | Temp 98.1°F | Ht 71.0 in | Wt 186.0 lb

## 2020-04-01 DIAGNOSIS — Z113 Encounter for screening for infections with a predominantly sexual mode of transmission: Secondary | ICD-10-CM | POA: Diagnosis not present

## 2020-04-01 DIAGNOSIS — E782 Mixed hyperlipidemia: Secondary | ICD-10-CM | POA: Diagnosis not present

## 2020-04-01 DIAGNOSIS — Z0000 Encounter for general adult medical examination without abnormal findings: Secondary | ICD-10-CM | POA: Diagnosis not present

## 2020-04-01 DIAGNOSIS — Z131 Encounter for screening for diabetes mellitus: Secondary | ICD-10-CM

## 2020-04-01 DIAGNOSIS — Z23 Encounter for immunization: Secondary | ICD-10-CM | POA: Diagnosis not present

## 2020-04-01 NOTE — Patient Instructions (Signed)
Preventive Care 19-31 Years Old, Male Preventive care refers to lifestyle choices and visits with your health care provider that can promote health and wellness. This includes:  A yearly physical exam. This is also called an annual well check.  Regular dental and eye exams.  Immunizations.  Screening for certain conditions.  Healthy lifestyle choices, such as eating a healthy diet, getting regular exercise, not using drugs or products that contain nicotine and tobacco, and limiting alcohol use. What can I expect for my preventive care visit? Physical exam Your health care provider will check:  Height and weight. These may be used to calculate body mass index (BMI), which is a measurement that tells if you are at a healthy weight.  Heart rate and blood pressure.  Your skin for abnormal spots. Counseling Your health care provider may ask you questions about:  Alcohol, tobacco, and drug use.  Emotional well-being.  Home and relationship well-being.  Sexual activity.  Eating habits.  Work and work Statistician. What immunizations do I need?  Influenza (flu) vaccine  This is recommended every year. Tetanus, diphtheria, and pertussis (Tdap) vaccine  You may need a Td booster every 10 years. Varicella (chickenpox) vaccine  You may need this vaccine if you have not already been vaccinated. Human papillomavirus (HPV) vaccine  If recommended by your health care provider, you may need three doses over 6 months. Measles, mumps, and rubella (MMR) vaccine  You may need at least one dose of MMR. You may also need a second dose. Meningococcal conjugate (MenACWY) vaccine  One dose is recommended if you are 45-76 years old and a Market researcher living in a residence hall, or if you have one of several medical conditions. You may also need additional booster doses. Pneumococcal conjugate (PCV13) vaccine  You may need this if you have certain conditions and were not  previously vaccinated. Pneumococcal polysaccharide (PPSV23) vaccine  You may need one or two doses if you smoke cigarettes or if you have certain conditions. Hepatitis A vaccine  You may need this if you have certain conditions or if you travel or work in places where you may be exposed to hepatitis A. Hepatitis B vaccine  You may need this if you have certain conditions or if you travel or work in places where you may be exposed to hepatitis B. Haemophilus influenzae type b (Hib) vaccine  You may need this if you have certain risk factors. You may receive vaccines as individual doses or as more than one vaccine together in one shot (combination vaccines). Talk with your health care provider about the risks and benefits of combination vaccines. What tests do I need? Blood tests  Lipid and cholesterol levels. These may be checked every 5 years starting at age 17.  Hepatitis C test.  Hepatitis B test. Screening   Diabetes screening. This is done by checking your blood sugar (glucose) after you have not eaten for a while (fasting).  Sexually transmitted disease (STD) testing. Talk with your health care provider about your test results, treatment options, and if necessary, the need for more tests. Follow these instructions at home: Eating and drinking   Eat a diet that includes fresh fruits and vegetables, whole grains, lean protein, and low-fat dairy products.  Take vitamin and mineral supplements as recommended by your health care provider.  Do not drink alcohol if your health care provider tells you not to drink.  If you drink alcohol: ? Limit how much you have to 0-2  drinks a day. ? Be aware of how much alcohol is in your drink. In the [place] one drink equals one 12 oz bottle of beer (355 mL), one 5 oz glass of wine (148 mL), or one 1 oz glass of hard liquor (44 mL). Lifestyle  Take daily care of your teeth and gums.  Stay active. Exercise for at least 30 minutes on 5 or  more days each week.  Do not use any products that contain nicotine or tobacco, such as cigarettes, e-cigarettes, and chewing tobacco. If you need help quitting, ask your health care provider.  If you are sexually active, practice safe sex. Use a condom or other form of protection to prevent STIs (sexually transmitted infections). What's next?  Go to your health care provider once a year for a well check visit.  Ask your health care provider how often you should have your eyes and teeth checked.  Stay up to date on all vaccines. This information is not intended to replace advice given to you by your health care provider. Make sure you discuss any questions you have with your health care provider. Document Revised: 05/30/2018 Document Reviewed: 05/30/2018 Elsevier Patient Education  2020 [employer].

## 2020-04-01 NOTE — Progress Notes (Signed)
Subjective:     Andrew Lee is a 29 y.o. male and is here for a comprehensive physical exam. The patient reports no problems.  Staying busy at work for [employer].   Requesting STI testing.  Social History   Socioeconomic History  . Marital status: Single    Spouse name: Not on file  . Number of children: Not on file  . Years of education: Not on file  . Highest education level: Not on file  Occupational History  . Not on file  Tobacco Use  . Smoking status: Never Smoker  . Smokeless tobacco: Never Used  Substance and Sexual Activity  . Alcohol use: Yes  . Drug use: Yes    Types: Marijuana  . Sexual activity: Yes  Other Topics Concern  . Not on file  Social History Narrative  . Not on file   Social Determinants of Health   Financial Resource Strain:   . Difficulty of Paying Living Expenses: Not on file  Food Insecurity:   . Worried About Programme researcher, broadcasting/film/video in the Last Year: Not on file  . Ran Out of Food in the Last Year: Not on file  Transportation Needs:   . Lack of Transportation (Medical): Not on file  . Lack of Transportation (Non-Medical): Not on file  Physical Activity:   . Days of Exercise per Week: Not on file  . Minutes of Exercise per Session: Not on file  Stress:   . Feeling of Stress : Not on file  Social Connections:   . Frequency of Communication with Friends and Family: Not on file  . Frequency of Social Gatherings with Friends and Family: Not on file  . Attends Religious Services: Not on file  . Active Member of Clubs or Organizations: Not on file  . Attends Banker Meetings: Not on file  . Marital Status: Not on file  Intimate Partner Violence:   . Fear of Current or Ex-Partner: Not on file  . Emotionally Abused: Not on file  . Physically Abused: Not on file  . Sexually Abused: Not on file   Health Maintenance  Topic Date Due  . Hepatitis C Screening  Never done  . TETANUS/TDAP  06/19/2016  . INFLUENZA VACCINE  01/18/2020  .  HIV Screening  Completed    The following portions of the patient's history were reviewed and updated as appropriate: allergies, current medications, past family history, past medical history, past social history, past surgical history and problem list.  Review of Systems A comprehensive review of systems was negative.   Objective:    BP 102/70 (BP Location: Left Arm, Patient Position: Sitting, Cuff Size: Normal)   Pulse (!) 54   Temp 98.1 F (36.7 C) (Oral)   Ht 5\' 11"  (1.803 m)   Wt 186 lb (84.4 kg)   SpO2 99%   BMI 25.94 kg/m  General appearance: alert, cooperative and no distress Head: Normocephalic, without obvious abnormality, atraumatic Eyes: conjunctivae/corneas clear. PERRL, EOM's intact. Fundi benign. Ears: normal TM's and external ear canals both ears Nose: Nares normal. Septum midline. Mucosa normal. No drainage or sinus tenderness. Throat: lips, mucosa, and tongue normal; teeth and gums normal Neck: no adenopathy, no carotid bruit, no JVD, supple, symmetrical, trachea midline and thyroid not enlarged, symmetric, no tenderness/mass/nodules Lungs: clear to auscultation bilaterally Heart: regular rate and rhythm, S1, S2 normal, no murmur, click, rub or gallop Abdomen: soft, non-tender; bowel sounds normal; no masses,  no organomegaly Extremities: extremities normal,  atraumatic, no cyanosis or edema Pulses: 2+ and symmetric Skin: Skin color, texture, turgor normal. No rashes or lesions Lymph nodes: Cervical, supraclavicular, and axillary nodes normal. Neurologic: Alert and oriented X 3, normal strength and tone. Normal symmetric reflexes. Normal coordination and gait    Assessment:    Healthy male exam.      Plan:     Anticipatory guidance given including wearing seatbelts, smoke detectors in the home, increasing physical activity, increasing p.o. intake of water and vegetables. -We will obtain labs -Given handout -Next CPE in 1 year See After Visit Summary for  Counseling Recommendations    Routine screening for STI (sexually transmitted infection)  - Plan: RPR, HIV antibody (with reflex), C. trachomatis/N. gonorrhoeae RNA  Need for influenza vaccination -influenza vaccine given this visit  Need for Tdap vaccination -tdap given this visit  Screening for diabetes mellitus -Plan: hemoglobin A1C   Mixed hyperlipidemia  - Plan: Lipid panel, Hemoglobin A1c  F/u prn   Abbe Amsterdam, MD

## 2020-04-02 LAB — CBC WITH DIFFERENTIAL/PLATELET
Absolute Monocytes: 281 cells/uL (ref 200–950)
Basophils Absolute: 21 cells/uL (ref 0–200)
Basophils Relative: 0.5 %
Eosinophils Absolute: 50 cells/uL (ref 15–500)
Eosinophils Relative: 1.2 %
HCT: 44.4 % (ref 38.5–50.0)
Hemoglobin: 15 g/dL (ref 13.2–17.1)
Lymphs Abs: 2482 cells/uL (ref 850–3900)
MCH: 32.5 pg (ref 27.0–33.0)
MCHC: 33.8 g/dL (ref 32.0–36.0)
MCV: 96.1 fL (ref 80.0–100.0)
MPV: 10.1 fL (ref 7.5–12.5)
Monocytes Relative: 6.7 %
Neutro Abs: 1365 cells/uL — ABNORMAL LOW (ref 1500–7800)
Neutrophils Relative %: 32.5 %
Platelets: 219 10*3/uL (ref 140–400)
RBC: 4.62 10*6/uL (ref 4.20–5.80)
RDW: 12.5 % (ref 11.0–15.0)
Total Lymphocyte: 59.1 %
WBC: 4.2 10*3/uL (ref 3.8–10.8)

## 2020-04-02 LAB — COMPLETE METABOLIC PANEL WITH GFR
AG Ratio: 1.9 (calc) (ref 1.0–2.5)
ALT: 14 U/L (ref 9–46)
AST: 17 U/L (ref 10–40)
Albumin: 4.4 g/dL (ref 3.6–5.1)
Alkaline phosphatase (APISO): 61 U/L (ref 36–130)
BUN: 19 mg/dL (ref 7–25)
CO2: 26 mmol/L (ref 20–32)
Calcium: 9.5 mg/dL (ref 8.6–10.3)
Chloride: 107 mmol/L (ref 98–110)
Creat: 1.05 mg/dL (ref 0.60–1.35)
GFR, Est African American: 109 mL/min/{1.73_m2} (ref >=60)
GFR, Est Non African American: 94 mL/min/{1.73_m2} (ref >=60)
Globulin: 2.3 g/dL (calc) (ref 1.9–3.7)
Glucose, Bld: 84 mg/dL (ref 65–99)
Potassium: 4.1 mmol/L (ref 3.5–5.3)
Sodium: 142 mmol/L (ref 135–146)
Total Bilirubin: 0.5 mg/dL (ref 0.2–1.2)
Total Protein: 6.7 g/dL (ref 6.1–8.1)

## 2020-04-02 LAB — C. TRACHOMATIS/N. GONORRHOEAE RNA
C. trachomatis RNA, TMA: NOT DETECTED
N. gonorrhoeae RNA, TMA: NOT DETECTED

## 2020-04-02 LAB — HIV ANTIBODY (ROUTINE TESTING W REFLEX): HIV 1&2 Ab, 4th Generation: NONREACTIVE

## 2020-04-02 LAB — LIPID PANEL
Cholesterol: 226 mg/dL — ABNORMAL HIGH (ref <200)
HDL: 58 mg/dL (ref >=40)
LDL Cholesterol (Calc): 143 mg/dL (calc) — ABNORMAL HIGH
Non-HDL Cholesterol (Calc): 168 mg/dL (calc) — ABNORMAL HIGH (ref <130)
Total CHOL/HDL Ratio: 3.9 (calc) (ref <5.0)
Triglycerides: 126 mg/dL (ref <150)

## 2020-04-02 LAB — RPR: RPR Ser Ql: NONREACTIVE

## 2020-04-02 LAB — HEMOGLOBIN A1C
Hgb A1c MFr Bld: 5.3 % of total Hgb (ref <5.7)
Mean Plasma Glucose: 105 (calc)
eAG (mmol/L): 5.8 (calc)

## 2020-04-02 LAB — TSH: TSH: 0.7 mIU/L (ref 0.40–4.50)

## 2020-04-02 LAB — T4, FREE: Free T4: 1.3 ng/dL (ref 0.8–1.8)

## 2021-04-04 ENCOUNTER — Other Ambulatory Visit: Payer: Self-pay

## 2021-04-04 ENCOUNTER — Encounter: Payer: Self-pay | Admitting: Family Medicine

## 2021-04-04 ENCOUNTER — Ambulatory Visit (INDEPENDENT_AMBULATORY_CARE_PROVIDER_SITE_OTHER): Payer: 59 | Admitting: Family Medicine

## 2021-04-04 VITALS — BP 124/82 | HR 70 | Temp 98.2°F | Ht 72.5 in | Wt 201.2 lb

## 2021-04-04 DIAGNOSIS — Z23 Encounter for immunization: Secondary | ICD-10-CM | POA: Diagnosis not present

## 2021-04-04 DIAGNOSIS — Z113 Encounter for screening for infections with a predominantly sexual mode of transmission: Secondary | ICD-10-CM

## 2021-04-04 DIAGNOSIS — E782 Mixed hyperlipidemia: Secondary | ICD-10-CM | POA: Diagnosis not present

## 2021-04-04 DIAGNOSIS — Z0000 Encounter for general adult medical examination without abnormal findings: Secondary | ICD-10-CM

## 2021-04-04 LAB — BASIC METABOLIC PANEL
BUN: 24 mg/dL — ABNORMAL HIGH (ref 6–23)
CO2: 26 mEq/L (ref 19–32)
Calcium: 9.5 mg/dL (ref 8.4–10.5)
Chloride: 105 mEq/L (ref 96–112)
Creatinine, Ser: 1.1 mg/dL (ref 0.40–1.50)
GFR: 88.84 mL/min (ref >60.00)
Glucose, Bld: 96 mg/dL (ref 70–99)
Potassium: 4.3 mEq/L (ref 3.5–5.1)
Sodium: 138 mEq/L (ref 135–145)

## 2021-04-04 LAB — CBC WITH DIFFERENTIAL/PLATELET
Basophils Absolute: 0 10*3/uL (ref 0.0–0.1)
Basophils Relative: 0.4 % (ref 0.0–3.0)
Eosinophils Absolute: 0.1 10*3/uL (ref 0.0–0.7)
Eosinophils Relative: 1.3 % (ref 0.0–5.0)
HCT: 45.1 % (ref 39.0–52.0)
Hemoglobin: 15.2 g/dL (ref 13.0–17.0)
Lymphocytes Relative: 48.7 % — ABNORMAL HIGH (ref 12.0–46.0)
Lymphs Abs: 2.1 10*3/uL (ref 0.7–4.0)
MCHC: 33.6 g/dL (ref 30.0–36.0)
MCV: 95.4 fl (ref 78.0–100.0)
Monocytes Absolute: 0.3 10*3/uL (ref 0.1–1.0)
Monocytes Relative: 8 % (ref 3.0–12.0)
Neutro Abs: 1.8 10*3/uL (ref 1.4–7.7)
Neutrophils Relative %: 41.6 % — ABNORMAL LOW (ref 43.0–77.0)
Platelets: 269 10*3/uL (ref 150.0–400.0)
RBC: 4.73 Mil/uL (ref 4.22–5.81)
RDW: 12.7 % (ref 11.5–15.5)
WBC: 4.3 10*3/uL (ref 4.0–10.5)

## 2021-04-04 LAB — LIPID PANEL
Cholesterol: 251 mg/dL — ABNORMAL HIGH (ref 0–200)
HDL: 57.7 mg/dL (ref >39.00)
LDL Cholesterol: 169 mg/dL — ABNORMAL HIGH (ref 0–99)
NonHDL: 193.45
Total CHOL/HDL Ratio: 4
Triglycerides: 121 mg/dL (ref 0.0–149.0)
VLDL: 24.2 mg/dL (ref 0.0–40.0)

## 2021-04-04 LAB — TSH: TSH: 0.48 u[IU]/mL (ref 0.35–5.50)

## 2021-04-04 LAB — T4, FREE: Free T4: 0.76 ng/dL (ref 0.60–1.60)

## 2021-04-04 LAB — HEMOGLOBIN A1C: Hgb A1c MFr Bld: 5.8 % (ref 4.6–6.5)

## 2021-04-04 NOTE — Progress Notes (Signed)
Subjective:     Andrew Lee is a 30 y.o. male and is here for a comprehensive physical exam.  Patient states he has been doing well.  Staying busy with work.  Still smoking MJ daily.  Denies any issues or concerns.  Interested in receiving influenza vaccine and STI check.  Also interested in rechecking cholesterol as it was elevated during last visit.  Social History   Socioeconomic History   Marital status: Single    Spouse name: Not on file   Number of children: Not on file   Years of education: Not on file   Highest education level: Not on file  Occupational History   Not on file  Tobacco Use   Smoking status: Never   Smokeless tobacco: Never  Substance and Sexual Activity   Alcohol use: Yes   Drug use: Yes    Types: Marijuana   Sexual activity: Yes  Other Topics Concern   Not on file  Social History Narrative   Not on file   Social Determinants of Health   Financial Resource Strain: Not on file  Food Insecurity: Not on file  Transportation Needs: Not on file  Physical Activity: Not on file  Stress: Not on file  Social Connections: Not on file  Intimate Partner Violence: Not on file   Health Maintenance  Topic Date Due   Hepatitis C Screening  Never done   INFLUENZA VACCINE  01/17/2021   TETANUS/TDAP  04/01/2030   HIV Screening  Completed   HPV VACCINES  Aged Out    The following portions of the patient's history were reviewed and updated as appropriate: allergies, current medications, past family history, past medical history, past social history, past surgical history, and problem list.  Review of Systems Pertinent items are noted in HPI.   Objective:    BP 124/82 (BP Location: Right Arm, Patient Position: Sitting, Cuff Size: Large)   Pulse 70   Temp 98.2 F (36.8 C) (Oral)   Ht 6' 0.5" (1.842 m)   Wt 201 lb 3.2 oz (91.3 kg)   SpO2 98%   BMI 26.91 kg/m  General appearance: alert, cooperative, and no distress Head: Normocephalic, without obvious  abnormality, atraumatic Eyes: conjunctivae/corneas clear. PERRL, EOM's intact. Fundi benign. Ears: normal TM's and external ear canals both ears Nose: Nares normal. Septum midline. Mucosa normal. No drainage or sinus tenderness. Throat: lips, mucosa, and tongue normal; teeth and gums normal Neck: no adenopathy, no carotid bruit, no JVD, supple, symmetrical, trachea midline, and thyroid not enlarged, symmetric, no tenderness/mass/nodules Lungs: clear to auscultation bilaterally Heart: regular rate and rhythm, S1, S2 normal, no murmur, click, rub or gallop Abdomen: soft, non-tender; bowel sounds normal; no masses,  no organomegaly Extremities: extremities normal, atraumatic, no cyanosis or edema Pulses: 2+ and symmetric Skin: Skin color, texture, turgor normal. No rashes or lesions Lymph nodes: Cervical, supraclavicular, and axillary nodes normal. Neurologic: Alert and oriented X 3, normal strength and tone. Normal symmetric reflexes. Normal coordination and gait    Assessment:    Healthy male exam.      Plan:    Anticipatory guidance given including wearing seatbelts, smoke detectors in the home, increasing physical activity, increasing p.o. intake of water and vegetables. -obtain labs -Immunizations reviewed. -Given handout -Next CPE in 1 year See After Visit Summary for Counseling Recommendations   Routine screening for STI (sexually transmitted infection)  - Plan: RPR, HIV Antibody (routine testing w rflx), C. trachomatis/N. gonorrhoeae RNA  Need for influenza vaccination -  Influenza vaccine given this visit  Mixed hyperlipidemia -Total cholesterol 226 and LDL 143 on 04/01/2020 -Lifestyle modifications - Plan: Lipid panel  Abbe Amsterdam, MD

## 2021-04-04 NOTE — Addendum Note (Signed)
Addended by: Elwin Mocha on: 04/04/2021 09:14 AM   Modules accepted: Orders

## 2021-04-05 LAB — C. TRACHOMATIS/N. GONORRHOEAE RNA
C. trachomatis RNA, TMA: NOT DETECTED
N. gonorrhoeae RNA, TMA: NOT DETECTED

## 2021-04-05 LAB — HIV ANTIBODY (ROUTINE TESTING W REFLEX): HIV 1&2 Ab, 4th Generation: NONREACTIVE

## 2021-04-05 LAB — RPR: RPR Ser Ql: NONREACTIVE

## 2021-09-12 ENCOUNTER — Ambulatory Visit: Payer: 59 | Admitting: Family Medicine

## 2021-09-12 ENCOUNTER — Other Ambulatory Visit (HOSPITAL_COMMUNITY)
Admission: RE | Admit: 2021-09-12 | Discharge: 2021-09-12 | Disposition: A | Discharge status: Home / Self Care | Payer: 59 | Source: Ambulatory Visit | Attending: Family Medicine | Admitting: Family Medicine

## 2021-09-12 ENCOUNTER — Encounter: Payer: Self-pay | Admitting: Family Medicine

## 2021-09-12 VITALS — BP 124/63 | HR 62 | Temp 98.4°F | Wt 204.6 lb

## 2021-09-12 DIAGNOSIS — Z113 Encounter for screening for infections with a predominantly sexual mode of transmission: Secondary | ICD-10-CM | POA: Diagnosis not present

## 2021-09-12 DIAGNOSIS — Z298 Encounter for other specified prophylactic measures: Secondary | ICD-10-CM

## 2021-09-12 DIAGNOSIS — Z23 Encounter for immunization: Secondary | ICD-10-CM

## 2021-09-12 DIAGNOSIS — Z7184 Encounter for health counseling related to travel: Secondary | ICD-10-CM

## 2021-09-12 MED ORDER — METRONIDAZOLE 500 MG PO TABS
500.0000 mg | ORAL_TABLET | Freq: Two times a day (BID) | ORAL | 0 refills | Status: AC
Start: 2021-09-12 — End: 2021-09-19

## 2021-09-12 MED ORDER — TYPHOID VACCINE PO CPDR: 1.0000 | DELAYED_RELEASE_CAPSULE | ORAL | 0 refills | Status: DC

## 2021-09-12 MED ORDER — MEFLOQUINE HCL 250 MG PO TABS: ORAL_TABLET | ORAL | 0 refills | Status: DC

## 2021-09-12 NOTE — Progress Notes (Signed)
Subjective:  ? ? Patient ID: Andrew Lee, male    DOB: 1990-08-29, 31 y.o.   MRN: [id number] ? ?Chief Complaint  ?Patient presents with  ? Exposure to STD  ?  Partner tested + for trich  ? Travel Consult  ?  Traveling to [place] in 2 weeks, for 5 days. Wants to know if any shots are needed.  ? ? ?HPI ?Patient was seen today for acute concern and upcoming travel.  Patient states a recent sexual partner tested positive for trichomonas 3 days ago.  Patient denies symptoms but states was sexually active with a partner early last week.  Patient interested in being treated and screening for other possible infections. ? ?Patient traveling to [place], [place] on 4/6-4/10.  Patient plans on riding Rvs and going on a boat to an [place].   ? ?Past Medical History:  ?Diagnosis Date  ? ADD (attention deficit disorder with hyperactivity)   ? Hyperlipidemia   ? ? ?No Known Allergies ? ?ROS ?General: Denies fever, chills, night sweats, changes in weight, changes in appetite ?HEENT: Denies headaches, ear pain, changes in vision, rhinorrhea, sore throat ?CV: Denies CP, palpitations, SOB, orthopnea ?Pulm: Denies SOB, cough, wheezing ?GI: Denies abdominal pain, nausea, vomiting, diarrhea, constipation ?GU: Denies dysuria, hematuria, frequency ?Msk: Denies muscle cramps, joint pains ?Neuro: Denies weakness, numbness, tingling ?Skin: Denies rashes, bruising ?Psych: Denies depression, anxiety, hallucinations ? ?   ?Objective:  ?  ?Blood pressure 124/63, pulse 62, temperature 98.4 ?F (36.9 ?C), temperature source Oral, weight 204 lb 9.6 oz (92.8 kg), SpO2 99 %. ? ?Gen. Pleasant, well-nourished, in no distress, normal affect   ?HEENT: [place]/AT, face symmetric, conjunctiva clear, no scleral icterus, PERRLA, EOMI, nares patent without drainage ?Lungs: no accessory muscle use ?Cardiovascular: RRR, no peripheral edema ?GU: Urethral swab obtained by CMA and RN ?Musculoskeletal: No deformities, no cyanosis or clubbing, normal tone ?Neuro:   A&Ox3, CN II-XII intact, normal gait ?Skin:  Warm, no lesions/ rash ? ? ?Wt Readings from Last 3 Encounters:  ?09/12/21 204 lb 9.6 oz (92.8 kg)  ?04/04/21 201 lb 3.2 oz (91.3 kg)  ?04/01/20 186 lb (84.4 kg)  ? ? ?Lab Results  ?Component Value Date  ? WBC 4.3 04/04/2021  ? HGB 15.2 04/04/2021  ? HCT 45.1 04/04/2021  ? PLT 269.0 04/04/2021  ? GLUCOSE 96 04/04/2021  ? CHOL 251 (H) 04/04/2021  ? TRIG 121.0 04/04/2021  ? HDL 57.70 04/04/2021  ? LDLDIRECT 131.1 03/08/2010  ? LDLCALC 169 (H) 04/04/2021  ? ALT 14 04/01/2020  ? AST 17 04/01/2020  ? NA 138 04/04/2021  ? K 4.3 04/04/2021  ? CL 105 04/04/2021  ? CREATININE 1.10 04/04/2021  ? BUN 24 (H) 04/04/2021  ? CO2 26 04/04/2021  ? TSH 0.48 04/04/2021  ? HGBA1C 5.8 04/04/2021  ? ? ?Assessment/Plan: ? ?Travel advice encounter ?-Travel 4/6-4/10/23 to [place], [place] ?-Tdap up to date done 04/01/2020 ?-Influenza vaccine done 04/04/2021 ?-Patient to consider second COVID booster though will not have full immunity benefit during trip as leaving in less than 2 weeks. ?-Discussed to obtain mosquito repellent for clothes and skin ? ?Need for malaria prophylaxis ? - Plan: mefloquine (LARIAM) 250 MG tablet ? ?Requires typhoid vaccination  ?-Advised were not helpful immunity as leaving in less than 2 weeks for trip. ?- Plan: typhoid (VIVOTIF) DR capsule ? ?Routine screening for STI (sexually transmitted infection)  ?-Exposure to trichomonas ?-Patient wishes to start treatment with Flagyl instead of waiting for results.  Advised Flagyl may  decrease the effectiveness of typhoid vaccine capsules. ?-discussed r/b/a of Flagyl. ?- Plan: RPR, HIV Antibody (routine testing w rflx), metroNIDAZOLE (FLAGYL) 500 MG tablet, Cytology (oral, anal, urethral) ancillary only ? ?F/u prn ? ?Abbe Amsterdam, MD ?

## 2021-09-12 NOTE — Patient Instructions (Addendum)
Pt not take Flagyl (metronidazole) at the same time you are taking the pills for typhoid vaccination as the Flagyl can decrease the effectiveness of the typhoid vaccine pills. ? ?Given the timing of your trip it is advised that you take the typhoid pills and wait on the results of the STI screening to see if you need to take Flagyl. ?

## 2021-09-13 LAB — HIV ANTIBODY (ROUTINE TESTING W REFLEX): HIV 1&2 Ab, 4th Generation: NONREACTIVE

## 2021-09-13 LAB — RPR: RPR Ser Ql: NONREACTIVE

## 2021-09-14 LAB — CYTOLOGY, (ORAL, ANAL, URETHRAL) ANCILLARY ONLY
Chlamydia: NEGATIVE
Comment: NEGATIVE
Comment: NEGATIVE
Comment: NORMAL
Neisseria Gonorrhea: NEGATIVE
Trichomonas: NEGATIVE

## 2022-04-13 ENCOUNTER — Ambulatory Visit (INDEPENDENT_AMBULATORY_CARE_PROVIDER_SITE_OTHER): Payer: 59 | Admitting: Family Medicine

## 2022-04-13 ENCOUNTER — Encounter: Payer: Self-pay | Admitting: Family Medicine

## 2022-04-13 VITALS — BP 112/70 | HR 68 | Temp 98.2°F | Ht 72.5 in | Wt 216.8 lb

## 2022-04-13 DIAGNOSIS — Z23 Encounter for immunization: Secondary | ICD-10-CM

## 2022-04-13 DIAGNOSIS — Z113 Encounter for screening for infections with a predominantly sexual mode of transmission: Secondary | ICD-10-CM

## 2022-04-13 DIAGNOSIS — E782 Mixed hyperlipidemia: Secondary | ICD-10-CM

## 2022-04-13 DIAGNOSIS — Z0000 Encounter for general adult medical examination without abnormal findings: Secondary | ICD-10-CM

## 2022-04-13 DIAGNOSIS — Z1159 Encounter for screening for other viral diseases: Secondary | ICD-10-CM

## 2022-04-13 LAB — BASIC METABOLIC PANEL
BUN: 19 mg/dL (ref 6–23)
CO2: 27 mEq/L (ref 19–32)
Calcium: 9.6 mg/dL (ref 8.4–10.5)
Chloride: 103 mEq/L (ref 96–112)
Creatinine, Ser: 1.02 mg/dL (ref 0.40–1.50)
GFR: 96.56 mL/min (ref >60.00)
Glucose, Bld: 89 mg/dL (ref 70–99)
Potassium: 4.1 mEq/L (ref 3.5–5.1)
Sodium: 137 mEq/L (ref 135–145)

## 2022-04-13 LAB — CBC WITH DIFFERENTIAL/PLATELET
Basophils Absolute: 0 10*3/uL (ref 0.0–0.1)
Basophils Relative: 0.4 % (ref 0.0–3.0)
Eosinophils Absolute: 0 10*3/uL (ref 0.0–0.7)
Eosinophils Relative: 0.8 % (ref 0.0–5.0)
HCT: 44.9 % (ref 39.0–52.0)
Hemoglobin: 14.9 g/dL (ref 13.0–17.0)
Lymphocytes Relative: 49.2 % — ABNORMAL HIGH (ref 12.0–46.0)
Lymphs Abs: 2.1 10*3/uL (ref 0.7–4.0)
MCHC: 33.1 g/dL (ref 30.0–36.0)
MCV: 93.9 fl (ref 78.0–100.0)
Monocytes Absolute: 0.3 10*3/uL (ref 0.1–1.0)
Monocytes Relative: 7.1 % (ref 3.0–12.0)
Neutro Abs: 1.8 10*3/uL (ref 1.4–7.7)
Neutrophils Relative %: 42.5 % — ABNORMAL LOW (ref 43.0–77.0)
Platelets: 225 10*3/uL (ref 150.0–400.0)
RBC: 4.78 Mil/uL (ref 4.22–5.81)
RDW: 12.8 % (ref 11.5–15.5)
WBC: 4.2 10*3/uL (ref 4.0–10.5)

## 2022-04-13 LAB — LIPID PANEL
Cholesterol: 258 mg/dL — ABNORMAL HIGH (ref 0–200)
HDL: 53.1 mg/dL (ref >39.00)
LDL Cholesterol: 187 mg/dL — ABNORMAL HIGH (ref 0–99)
NonHDL: 205.27
Total CHOL/HDL Ratio: 5
Triglycerides: 93 mg/dL (ref 0.0–149.0)
VLDL: 18.6 mg/dL (ref 0.0–40.0)

## 2022-04-13 LAB — T4, FREE: Free T4: 0.84 ng/dL (ref 0.60–1.60)

## 2022-04-13 LAB — HEMOGLOBIN A1C: Hgb A1c MFr Bld: 6.1 % (ref 4.6–6.5)

## 2022-04-13 LAB — TSH: TSH: 0.53 u[IU]/mL (ref 0.35–5.50)

## 2022-04-13 NOTE — Progress Notes (Signed)
Subjective:     Andrew Lee is a 31 y.o. male and is here for a comprehensive physical exam. Patient states he is doing well.  Is the proud father of a 4-month-old baby boy named Press photographer.  Patient requesting STI testing.  Interested in influenza vaccine.  Social History   Socioeconomic History   Marital status: Single    Spouse name: Not on file   Number of children: Not on file   Years of education: Not on file   Highest education level: Some college, no degree  Occupational History   Not on file  Tobacco Use   Smoking status: Never   Smokeless tobacco: Never  Substance and Sexual Activity   Alcohol use: Yes   Drug use: Yes    Types: Marijuana   Sexual activity: Yes  Other Topics Concern   Not on file  Social History Narrative   Not on file   Social Determinants of Health   Financial Resource Strain: Low Risk  (09/10/2021)   Overall Financial Resource Strain (CARDIA)    Difficulty of Paying Living Expenses: Not very hard  Food Insecurity: No Food Insecurity (09/10/2021)   Hunger Vital Sign    Worried About Running Out of Food in the Last Year: Never true    Ran Out of Food in the Last Year: Never true  Transportation Needs: No Transportation Needs (09/10/2021)   PRAPARE - Administrator, Civil Service (Medical): No    Lack of Transportation (Non-Medical): No  Physical Activity: Sufficiently Active (09/10/2021)   Exercise Vital Sign    Days of Exercise per Week: 4 days    Minutes of Exercise per Session: 60 min  Stress: No Stress Concern Present (09/10/2021)   [employer] of Occupational Health - Occupational Stress Questionnaire    Feeling of Stress : Only a little  Social Connections: Moderately Isolated (09/10/2021)   Social Connection and Isolation Panel [NHANES]    Frequency of Communication with Friends and Family: Three times a week    Frequency of Social Gatherings with Friends and Family: Twice a week    Attends Religious Services: 1 to 4  times per year    Active Member of [employer] or Organizations: No    Attends Engineer, structural: Not on file    Marital Status: Never married  Intimate Partner Violence: Not on file   Health Maintenance  Topic Date Due   Hepatitis C Screening  Never done   INFLUENZA VACCINE  01/17/2022   TETANUS/TDAP  04/01/2030   HIV Screening  Completed   HPV VACCINES  Aged Out    The following portions of the patient's history were reviewed and updated as appropriate: allergies, current medications, past family history, past medical history, past social history, past surgical history, and problem list.  Review of Systems Pertinent items noted in HPI and remainder of comprehensive ROS otherwise negative.   Objective:    BP 112/70 (BP Location: Left Arm, Patient Position: Sitting, Cuff Size: Large)   Pulse 68   Temp 98.2 F (36.8 C) (Oral)   Ht 6' 0.5" (1.842 m)   Wt 216 lb 12.8 oz (98.3 kg)   SpO2 99%   BMI 29.00 kg/m  General appearance: alert, cooperative, and no distress Head: Normocephalic, without obvious abnormality, atraumatic Eyes: conjunctivae/corneas clear. PERRL, EOM's intact. Fundi benign. Ears: normal TM's and external ear canals both ears Nose: Nares normal. Septum midline. Mucosa normal. No drainage or sinus tenderness. Throat: lips, mucosa, and  tongue normal; teeth and gums normal Neck: no adenopathy, no carotid bruit, no JVD, supple, symmetrical, trachea midline, and thyroid not enlarged, symmetric, no tenderness/mass/nodules Lungs: clear to auscultation bilaterally Heart: regular rate and rhythm, S1, S2 normal, no murmur, click, rub or gallop Abdomen: soft, non-tender; bowel sounds normal; no masses,  no organomegaly Extremities: extremities normal, atraumatic, no cyanosis or edema Pulses: 2+ and symmetric Skin: Skin color, texture, turgor normal. No rashes or lesions Lymph nodes: Cervical, supraclavicular, and axillary nodes normal. Neurologic: Alert and  oriented X 3, normal strength and tone. Normal symmetric reflexes. Normal coordination and gait    Assessment:    Healthy male exam.      Plan:    Anticipatory guidance given including wearing seatbelts, smoke detectors in the home, increasing physical activity, increasing p.o. intake of water and vegetables. -labs -immunizations reviewed.  Influenza vaccine given this visit -Pt Caryl Comes handout -Next CPE in 1 year See After Visit Summary for Counseling Recommendations  Well adult exam - Plan: RPR, CBC with Differential/Platelet, Basic metabolic panel, TSH, T4, Free, Hemoglobin A1c  Need for influenza vaccination  - Plan: Flu Vaccine QUAD 6+ mos PF IM (Fluarix Quad PF)  Routine screening for STI (sexually transmitted infection)  - Plan: HIV Antibody (routine testing w rflx), RPR, C. trachomatis/N. gonorrhoeae RNA  Mixed hyperlipidemia -Total cholesterol 251, LDL 169 on 04/04/2021 -Lifestyle modifications encouraged  - Plan: Lipid panel  Follow-up as needed  Grier Mitts, MD

## 2022-04-14 LAB — HIV ANTIBODY (ROUTINE TESTING W REFLEX): HIV 1&2 Ab, 4th Generation: NONREACTIVE

## 2022-04-14 LAB — C. TRACHOMATIS/N. GONORRHOEAE RNA
C. trachomatis RNA, TMA: NOT DETECTED
N. gonorrhoeae RNA, TMA: NOT DETECTED

## 2022-04-14 LAB — RPR: RPR Ser Ql: NONREACTIVE

## 2022-09-18 ENCOUNTER — Encounter: Payer: Self-pay | Admitting: Family Medicine

## 2022-09-18 ENCOUNTER — Ambulatory Visit: Payer: 59 | Admitting: Family Medicine

## 2022-09-18 VITALS — BP 124/62 | HR 71 | Temp 98.7°F | Ht 72.5 in | Wt 216.0 lb

## 2022-09-18 DIAGNOSIS — Z113 Encounter for screening for infections with a predominantly sexual mode of transmission: Secondary | ICD-10-CM | POA: Diagnosis not present

## 2022-09-18 NOTE — Patient Instructions (Signed)
Behavioral Health Services: -to make an appointment contact the office/provider you are interested in seeing.  No referral is needed.  The below is not an all inclusive list, but will help you get started.  [email or web address] -counseling located off of [address]  [email or web address] -website helps you find providers in your area  [facility] -Located off of [address] across from [employer]  Dr. Akintayo is a Psychiatrist with [facility]. [phone]  [facility]  [facility]  -[address]  [phone] -a place in town that has counseling and Psychiatry services.    

## 2022-09-18 NOTE — Progress Notes (Addendum)
   Established Patient Office Visit   Subjective  Patient ID: Andrew Lee, male    DOB: 1991/05/04  Age: 32 y.o. MRN: [id number]  No chief complaint on file.   Pt is a 32 yo male who presents for STI testing.  Pt denies symptoms. Just wants to have regular check.  Pt not always using condoms.  Interested in [facility].    Patient Active Problem List   Diagnosis Date Noted   Exposure to STD 08/29/2010   SORE THROAT 01/22/2008   ADD 06/24/2007   History reviewed. No pertinent surgical history. Social History   Tobacco Use   Smoking status: Never   Smokeless tobacco: Never  Substance Use Topics   Alcohol use: Yes   Drug use: Yes    Types: Marijuana   Family History  Problem Relation Age of Onset   Hyperlipidemia Unknown        family hx   Hyperlipidemia Father    No Known Allergies    ROS Negative unless stated above    Objective:     BP 124/62 (BP Location: Right Arm, Patient Position: Sitting, Cuff Size: Large)   Pulse 71   Temp 98.7 F (37.1 C) (Oral)   Ht 6' 0.5" (1.842 m)   Wt 216 lb (98 kg)   SpO2 98%   BMI 28.89 kg/m    Physical Exam Constitutional:      General: He is not in acute distress.    Appearance: Normal appearance.  HENT:     Head: Normocephalic and atraumatic.     Nose: Nose normal.     Mouth/Throat:     Mouth: Mucous membranes are moist.  Cardiovascular:     Rate and Rhythm: Normal rate and regular rhythm.     Heart sounds: Normal heart sounds. No murmur heard.    No gallop.  Pulmonary:     Effort: Pulmonary effort is normal. No respiratory distress.     Breath sounds: Normal breath sounds. No wheezing, rhonchi or rales.  Skin:    General: Skin is warm and dry.  Neurological:     Mental Status: He is alert and oriented to person, place, and time.     No results found for any visits on 09/18/22.    Assessment & Plan:  Routine screening for STI (sexually transmitted infection) -     RPR -     HIV Antibody  (routine testing w rflx) -     Chlamydia/Gonococcus/Trichomonas, NAA  Given area [facility] provider info.  Return if symptoms worsen or fail to improve.   Billie Ruddy, MD

## 2022-09-18 NOTE — Addendum Note (Signed)
Addended by: Grier Mitts R on: 09/18/2022 02:43 PM   Modules accepted: Orders

## 2022-09-20 LAB — CHLAMYDIA/GONOCOCCUS/TRICHOMONAS, NAA
Chlamydia by NAA: NEGATIVE
Gonococcus by NAA: NEGATIVE
Trich vag by NAA: NEGATIVE

## 2022-09-21 LAB — RPR: RPR Ser Ql: NONREACTIVE

## 2022-09-21 LAB — C. TRACHOMATIS/N. GONORRHOEAE RNA

## 2022-09-21 LAB — HIV ANTIBODY (ROUTINE TESTING W REFLEX): HIV 1&2 Ab, 4th Generation: NONREACTIVE

## 2022-12-14 ENCOUNTER — Ambulatory Visit: Payer: 59 | Admitting: Family Medicine

## 2022-12-15 ENCOUNTER — Encounter: Payer: Self-pay | Admitting: Family Medicine

## 2022-12-15 ENCOUNTER — Ambulatory Visit: Payer: 59 | Admitting: Family Medicine

## 2022-12-15 VITALS — BP 108/76 | HR 85 | Temp 98.7°F | Wt 210.4 lb

## 2022-12-15 DIAGNOSIS — Z113 Encounter for screening for infections with a predominantly sexual mode of transmission: Secondary | ICD-10-CM

## 2022-12-15 NOTE — Progress Notes (Signed)
   Established Patient Office Visit   Subjective  Patient ID: Andrew Lee, male    DOB: [birth year]  Age: 32 y.o. MRN: [id number]  Chief Complaint  Patient presents with   Exposure to STD    No burning, or drainage. Got a call from someone that he MAY have been exposed.     Patient is a 32 year old male seen for acute concern.  Patient requesting STI testing.  Denies symptoms.  Exposure to STD    Past Medical History:  Diagnosis Date   ADD (attention deficit disorder with hyperactivity)    Hyperlipidemia    No past surgical history on file. Social History   Tobacco Use   Smoking status: Never   Smokeless tobacco: Never  Substance Use Topics   Alcohol use: Yes   Drug use: Yes    Types: Marijuana   Family History  Problem Relation Age of Onset   Hyperlipidemia Unknown        family hx   Hyperlipidemia Father    No Known Allergies    ROS Negative unless stated above    Objective:     BP 108/76 (BP Location: Right Arm, Patient Position: Sitting, Cuff Size: Normal)   Pulse 85   Temp 98.7 F (37.1 C) (Oral)   Wt 210 lb 6.4 oz (95.4 kg)   SpO2 97%   BMI 28.14 kg/m    Physical Exam Constitutional:      Appearance: Normal appearance.  HENT:     Head: Normocephalic and atraumatic.     Nose: Nose normal.     Mouth/Throat:     Mouth: Mucous membranes are moist.  Cardiovascular:     Rate and Rhythm: Normal rate.  Pulmonary:     Effort: Pulmonary effort is normal.  Skin:    General: Skin is warm and dry.  Neurological:     Mental Status: He is alert. Mental status is at baseline.     No results found for any visits on 12/15/22.    Assessment & Plan:  Routine screening for STI (sexually transmitted infection) -     RPR; Future -     HIV Antibody (routine testing w rflx); Future -     C. trachomatis/N. gonorrhoeae RNA; Future  Obtain labs to screen for STIs..  Safe sex practices encouraged.  No follow-ups on file.   Deeann Saint, MD

## 2022-12-16 LAB — C. TRACHOMATIS/N. GONORRHOEAE RNA
C. trachomatis RNA, TMA: NOT DETECTED
N. gonorrhoeae RNA, TMA: NOT DETECTED

## 2022-12-16 LAB — RPR: RPR Ser Ql: NONREACTIVE

## 2022-12-16 LAB — HIV ANTIBODY (ROUTINE TESTING W REFLEX): HIV 1&2 Ab, 4th Generation: NONREACTIVE

## 2024-02-21 ENCOUNTER — Encounter: Admitting: Family Medicine
